# Patient Record
Sex: Female | Born: 1980 | Hispanic: Yes | Marital: Single | Smoking: Never smoker
Health system: Southern US, Community
[De-identification: ages and names within clinical notes are randomized; demographics above are authoritative.]

---

## 2017-10-21 ENCOUNTER — Ambulatory Visit: Payer: Self-pay | Attending: Oncology

## 2017-10-21 ENCOUNTER — Encounter (INDEPENDENT_AMBULATORY_CARE_PROVIDER_SITE_OTHER): Payer: Self-pay

## 2017-10-21 ENCOUNTER — Ambulatory Visit
Admission: RE | Admit: 2017-10-21 | Discharge: 2017-10-21 | Disposition: A | Discharge status: Home / Self Care | Payer: Self-pay | Source: Ambulatory Visit | Attending: Oncology | Admitting: Oncology

## 2017-10-21 ENCOUNTER — Other Ambulatory Visit: Payer: Self-pay

## 2017-10-21 VITALS — BP 103/62 | HR 68 | Temp 98.2°F | Ht 61.0 in | Wt 117.0 lb

## 2017-10-21 DIAGNOSIS — Z0000 Encounter for general adult medical examination without abnormal findings: Secondary | ICD-10-CM

## 2017-10-21 NOTE — Progress Notes (Signed)
Letter mailed from [facility] to notify of normal mammogram results.  Patient to return in one year for annual screening.  Copy to [facility]. 

## 2017-10-21 NOTE — Progress Notes (Signed)
Subjective:     Patient ID: Beth Allen, female   DOB: Jun 30, 1981, 36 y.o.   MRN: [id number]  HPI   Review of Systems     Objective:   Physical Exam  Pulmonary/Chest: Right breast exhibits no inverted nipple, no mass, no nipple discharge, no skin change and no tenderness. Left breast exhibits no inverted nipple, no mass, no nipple discharge, no skin change and no tenderness. Breasts are symmetrical.       Assessment:     36 year old hispanic female patient presents for B[facility]CP clinic visit.  Patient has complaint of outer quadrant breast pain.  Beth Allen interpreted exam. Patient screened, and meets BCCCP eligibility.  Patient does not have insurance, Medicare or Medicaid.  Handout given on Affordable Care Act.  Instructed patient on breast self-exam using teach back method.  CBE unremarkable.  Palpated symmetrical fibroglandular tissue upper outer quadrants.     Plan:     Sent for bilateral screening mammogram.

## 2019-01-26 ENCOUNTER — Encounter: Payer: Self-pay | Admitting: Emergency Medicine

## 2019-01-26 ENCOUNTER — Emergency Department
Admission: EM | Admit: 2019-01-26 | Discharge: 2019-01-26 | Disposition: A | Discharge status: Home / Self Care | Payer: Self-pay | Attending: Emergency Medicine | Admitting: Emergency Medicine

## 2019-01-26 ENCOUNTER — Other Ambulatory Visit: Payer: Self-pay

## 2019-01-26 DIAGNOSIS — N938 Other specified abnormal uterine and vaginal bleeding: Secondary | ICD-10-CM | POA: Insufficient documentation

## 2019-01-26 LAB — POCT PREGNANCY, URINE: PREG TEST UR: NEGATIVE

## 2019-01-26 LAB — URINALYSIS, COMPLETE (UACMP) WITH MICROSCOPIC
Bacteria, UA: NONE SEEN
Bilirubin Urine: NEGATIVE
Glucose, UA: NEGATIVE mg/dL
Ketones, ur: NEGATIVE mg/dL
Nitrite: NEGATIVE
PROTEIN: 30 mg/dL — AB
Specific Gravity, Urine: 1.01 (ref 1.005–1.030)
pH: 8 (ref 5.0–8.0)

## 2019-01-26 LAB — COMPREHENSIVE METABOLIC PANEL
ALT: 14 U/L (ref 0–44)
AST: 21 U/L (ref 15–41)
Albumin: 4.4 g/dL (ref 3.5–5.0)
Alkaline Phosphatase: 49 U/L (ref 38–126)
Anion gap: 7 (ref 5–15)
BUN: 10 mg/dL (ref 6–20)
CHLORIDE: 106 mmol/L (ref 98–111)
CO2: 27 mmol/L (ref 22–32)
Calcium: 9.1 mg/dL (ref 8.9–10.3)
Creatinine, Ser: 0.59 mg/dL (ref 0.44–1.00)
GFR calc Af Amer: >60 mL/min (ref >60)
GFR calc non Af Amer: >60 mL/min (ref >60)
Glucose, Bld: 93 mg/dL (ref 70–99)
Potassium: 4.2 mmol/L (ref 3.5–5.1)
Sodium: 140 mmol/L (ref 135–145)
Total Bilirubin: 0.4 mg/dL (ref 0.3–1.2)
Total Protein: 7.1 g/dL (ref 6.5–8.1)

## 2019-01-26 LAB — CBC
HCT: 40.7 % (ref 36.0–46.0)
HEMOGLOBIN: 13.6 g/dL (ref 12.0–15.0)
MCH: 29.6 pg (ref 26.0–34.0)
MCHC: 33.4 g/dL (ref 30.0–36.0)
MCV: 88.5 fL (ref 80.0–100.0)
Platelets: 268 10*3/uL (ref 150–400)
RBC: 4.6 MIL/uL (ref 3.87–5.11)
RDW: 12.1 % (ref 11.5–15.5)
WBC: 6.6 10*3/uL (ref 4.0–10.5)
nRBC: 0 % (ref 0.0–0.2)

## 2019-01-26 LAB — LIPASE, BLOOD: LIPASE: 27 U/L (ref 11–51)

## 2019-01-26 NOTE — ED Notes (Signed)
AAOx3.  Skin warm and dry.  NAD 

## 2019-01-26 NOTE — ED Triage Notes (Signed)
PT c/o lower abd pain with vaginal bleeding. States her period has been irregular this month. PT states weakness. VSS

## 2019-01-26 NOTE — ED Provider Notes (Signed)
[facility] Emergency Department Provider Note   ____________________________________________    I have reviewed the triage vital signs and the nursing notes.   HISTORY  Chief Complaint Vaginal Bleeding   Spanish interpreter used  HPI Beth Allen is a 38 y.o. female who presents with complaints of vaginal bleeding.  Patient reports she had her usual menstrual cycle on February 3-7 however approximately 1 week ago she started bleeding again at times heavily.  Occasional abdominal cramping but none today.  No nausea or vomiting.  Not on birth control.  No history of similar bleeding.  No new medications  History reviewed. No pertinent past medical history.  There are no active problems to display for this patient.   History reviewed. No pertinent surgical history.  Prior to Admission medications   Not on File     Allergies Patient has no known allergies.  No family history on file.  Social History Social History   Tobacco Use  . Smoking status: Never Smoker  . Smokeless tobacco: Never Used  Substance Use Topics  . Alcohol use: Not Currently  . Drug use: Never    Review of Systems  Constitutional: No fever/chills Eyes: No visual changes.  ENT: No sore throat. Cardiovascular: Denies chest pain. Respiratory: Denies shortness of breath. Gastrointestinal: As above Genitourinary: As above Musculoskeletal: Negative for back pain. Skin: Negative for rash. Neurological: Negative for headaches   ____________________________________________   PHYSICAL EXAM:  VITAL SIGNS: ED Triage Vitals  Enc Vitals Group     BP 01/26/19 1405 109/62     Pulse Rate 01/26/19 1405 88     Resp 01/26/19 1405 17     Temp 01/26/19 1405 98.4 F (36.9 C)     Temp Source 01/26/19 1405 Oral     SpO2 01/26/19 1405 100 %     Weight 01/26/19 1406 55.3 kg (122 lb)     Height 01/26/19 1406 1.575 m (5\' 2" )     Head Circumference --      Peak Flow --        Pain Score 01/26/19 1405 5     Pain Loc --      Pain Edu? --      Excl. in GC? --     Constitutional: Alert and oriented. No acute distress. Nose: No congestion/rhinnorhea. Mouth/Throat: Mucous membranes are moist.    Cardiovascular: Normal rate, regular rhythm. Grossly normal heart sounds.  Good peripheral circulation. Respiratory: Normal respiratory effort.  No retractions. Lungs CTAB. Gastrointestinal: Soft and nontender. No distention.   Genitourinary: deferred for specialist Musculoskeletal:  Warm and well perfused Neurologic:  Normal speech and language. No gross focal neurologic deficits are appreciated.  Skin:  Skin is warm, dry and intact. No rash noted.   ____________________________________________   LABS (all labs ordered are listed, but only abnormal results are displayed)  Labs Reviewed  URINALYSIS, COMPLETE (UACMP) WITH MICROSCOPIC - Abnormal; Notable for the following components:      Result Value   Color, Urine YELLOW (*)    APPearance CLEAR (*)    Hgb urine dipstick LARGE (*)    Protein, ur 30 (*)    Leukocytes,Ua TRACE (*)    RBC / HPF >50 (*)    All other components within normal limits  LIPASE, BLOOD  COMPREHENSIVE METABOLIC PANEL  CBC  POC URINE PREG, ED  POCT PREGNANCY, URINE   ____________________________________________  EKG  None ____________________________________________  RADIOLOGY  None ____________________________________________   PROCEDURES  Procedure(s) performed: No  Procedures   Critical Care performed: No ____________________________________________   INITIAL IMPRESSION / ASSESSMENT AND PLAN / ED COURSE  Pertinent labs & imaging results that were available during my care of the patient were reviewed by me and considered in my medical decision making (see chart for details).  Patient well-appearing and in no acute distress, negative pregnancy test, lab work is quite reassuring, hemoglobin 13.6.  Normal vital  sign, unremarkable exam.  Discussed with patient the need for outpatient follow-up with gynecology for further evaluation of dysfunctional uterine bleeding    ____________________________________________   FINAL CLINICAL IMPRESSION(S) / ED DIAGNOSES  Final diagnoses:  Dysfunctional uterine bleeding        Note:  This document was prepared using Dragon voice recognition software and may include unintentional dictation errors.   Jene Every, MD 01/26/19 [phone]

## 2020-07-20 ENCOUNTER — Ambulatory Visit: Payer: Self-pay | Attending: Oncology

## 2020-07-20 ENCOUNTER — Other Ambulatory Visit: Payer: Self-pay

## 2020-07-20 VITALS — BP 98/38 | HR 69 | Temp 97.9°F | Ht 61.75 in | Wt 126.0 lb

## 2020-07-20 DIAGNOSIS — N644 Mastodynia: Secondary | ICD-10-CM

## 2020-07-20 DIAGNOSIS — N63 Unspecified lump in unspecified breast: Secondary | ICD-10-CM

## 2020-07-20 NOTE — Progress Notes (Signed)
  Subjective:     Patient ID: Beth Allen, female   DOB: Sep 23, 1981, 39 y.o.   MRN: [id number]  HPI   Review of Systems     Objective:   Physical Exam Chest:     Breasts:        Right: Tenderness present.        Left: Tenderness present.       Comments: Bilateral breast thickening, and pain       Assessment:     39 year old Hispanic patient returns for B[facility]CP screening.  Delos Haring interprets exam.  Patient complains of constant right breast pain x 3 months, and reports left breast upper outer pain on palpation today.  Patient screened, and meets BCCCP eligibility.  Patient does not have insurance, Medicare or Medicaid. Instructed patient on breast self awareness using teach back method.  Clinical breast exam reveals bilateral upper outer quadrant thickening bilateral, and right breast thickening 6 o'clock with associated tenderness.    Plan:     Scheduled patient for bilateral diagnostic mammogram, and ultrasound 07/27/2020.  Printed reminder given to patient.

## 2020-07-27 ENCOUNTER — Ambulatory Visit
Admission: RE | Admit: 2020-07-27 | Discharge: 2020-07-27 | Disposition: A | Discharge status: Home / Self Care | Payer: Self-pay | Source: Ambulatory Visit | Attending: Oncology | Admitting: Oncology

## 2020-07-27 ENCOUNTER — Other Ambulatory Visit: Payer: Self-pay

## 2020-07-27 DIAGNOSIS — N63 Unspecified lump in unspecified breast: Secondary | ICD-10-CM

## 2020-07-27 DIAGNOSIS — N644 Mastodynia: Secondary | ICD-10-CM

## 2020-07-27 NOTE — Progress Notes (Signed)
Letter mailed from [facility] to notify of normal mammogram results.  Patient to return in one year for annual screening.  Copy to [facility]. 

## 2021-09-26 NOTE — Progress Notes (Signed)
Patient pre-screened for B[facility] eligibility due to COVID 19 precautions. Two patient identifiers used for verification that I was speaking to correct patient.  Patient to Present directly to [facility] 09/27/21 for B[facility] screening mammogram.  

## 2021-09-27 ENCOUNTER — Ambulatory Visit
Admission: RE | Admit: 2021-09-27 | Discharge: 2021-09-27 | Disposition: A | Discharge status: Home / Self Care | Payer: Self-pay | Source: Ambulatory Visit | Attending: Oncology | Admitting: Oncology

## 2021-09-27 ENCOUNTER — Ambulatory Visit: Payer: Self-pay | Attending: Oncology

## 2021-09-27 ENCOUNTER — Other Ambulatory Visit: Payer: Self-pay

## 2021-09-27 DIAGNOSIS — Z0000 Encounter for general adult medical examination without abnormal findings: Secondary | ICD-10-CM | POA: Insufficient documentation

## 2021-09-28 ENCOUNTER — Other Ambulatory Visit: Payer: Self-pay

## 2021-09-28 DIAGNOSIS — R920 Mammographic microcalcification found on diagnostic imaging of breast: Secondary | ICD-10-CM

## 2021-10-12 ENCOUNTER — Ambulatory Visit
Admission: RE | Admit: 2021-10-12 | Discharge: 2021-10-12 | Disposition: A | Discharge status: Home / Self Care | Payer: Self-pay | Source: Ambulatory Visit | Attending: Oncology | Admitting: Oncology

## 2021-10-12 ENCOUNTER — Other Ambulatory Visit: Payer: Self-pay

## 2021-10-12 DIAGNOSIS — R920 Mammographic microcalcification found on diagnostic imaging of breast: Secondary | ICD-10-CM | POA: Insufficient documentation

## 2021-12-06 ENCOUNTER — Encounter: Payer: Self-pay | Admitting: *Deleted

## 2021-12-06 ENCOUNTER — Other Ambulatory Visit: Payer: Self-pay | Admitting: *Deleted

## 2021-12-06 DIAGNOSIS — R920 Mammographic microcalcification found on diagnostic imaging of breast: Secondary | ICD-10-CM

## 2021-12-06 NOTE — Progress Notes (Signed)
Letter mailed to inform patient of her next mammogram on 04/02/22 @ 10:20.  Will transition care to Fonnie Mu, RN with [facility].

## 2022-04-12 ENCOUNTER — Ambulatory Visit
Admission: RE | Admit: 2022-04-12 | Discharge: 2022-04-12 | Disposition: A | Discharge status: Home / Self Care | Payer: Self-pay | Source: Ambulatory Visit | Attending: Obstetrics and Gynecology | Admitting: Obstetrics and Gynecology

## 2022-04-12 DIAGNOSIS — R920 Mammographic microcalcification found on diagnostic imaging of breast: Secondary | ICD-10-CM

## 2022-04-25 ENCOUNTER — Other Ambulatory Visit: Payer: Self-pay | Admitting: Obstetrics and Gynecology

## 2022-04-25 DIAGNOSIS — N63 Unspecified lump in unspecified breast: Secondary | ICD-10-CM

## 2022-04-26 ENCOUNTER — Other Ambulatory Visit: Payer: Self-pay

## 2022-04-26 DIAGNOSIS — N6310 Unspecified lump in the right breast, unspecified quadrant: Secondary | ICD-10-CM

## 2022-08-02 ENCOUNTER — Telehealth: Payer: Self-pay | Admitting: *Deleted

## 2022-10-02 ENCOUNTER — Ambulatory Visit: Payer: Self-pay

## 2022-10-10 ENCOUNTER — Ambulatory Visit: Payer: Self-pay

## 2022-10-15 ENCOUNTER — Ambulatory Visit: Payer: Self-pay | Attending: Hematology and Oncology | Admitting: Hematology and Oncology

## 2022-10-15 ENCOUNTER — Other Ambulatory Visit: Payer: Self-pay | Admitting: Obstetrics and Gynecology

## 2022-10-15 ENCOUNTER — Ambulatory Visit
Admission: RE | Admit: 2022-10-15 | Discharge: 2022-10-15 | Disposition: A | Discharge status: Home / Self Care | Payer: Self-pay | Source: Ambulatory Visit | Attending: Obstetrics and Gynecology | Admitting: Obstetrics and Gynecology

## 2022-10-15 VITALS — BP 113/65 | Wt 141.9 lb

## 2022-10-15 DIAGNOSIS — N6310 Unspecified lump in the right breast, unspecified quadrant: Secondary | ICD-10-CM | POA: Insufficient documentation

## 2022-10-15 NOTE — Progress Notes (Signed)
Beth Allen is a 41 y.o. female who presents to [facility] clinic today with no complaints.    Pap Smear: Pap not smear completed today. Last Pap smear was 2022 at [facility] clinic and was normal. Per patient has no history of an abnormal Pap smear. Last Pap smear result is not available in Epic.   Physical exam: Breasts Breasts symmetrical. No skin abnormalities bilateral breasts. No nipple retraction bilateral breasts. No nipple discharge bilateral breasts. No lymphadenopathy. No lumps palpated bilateral breasts.     MS DIGITAL DIAG TOMO UNI RIGHT  Result Date: 04/12/2022 CLINICAL DATA:  Short-term follow-up for probably benign right breast calcifications, initially assessed on 10/12/2021. EXAM: DIGITAL DIAGNOSTIC UNILATERAL RIGHT MAMMOGRAM WITH TOMOSYNTHESIS AND CAD TECHNIQUE: Right digital diagnostic mammography and breast tomosynthesis was performed. The images were evaluated with computer-aided detection. COMPARISON:  Previous exam(s). ACR Breast Density Category d: The breast tissue is extremely dense, which lowers the sensitivity of mammography. FINDINGS: The loosely grouped punctate and small round calcifications in the lateral right breast are stable. There are no masses or areas of architectural distortion. There are no new calcifications. IMPRESSION: 1. Probably benign right breast calcifications, stable for 6 months. Additional short-term follow-up recommended. RECOMMENDATION: Diagnostic mammography with right breast magnification views in 6 months. I have discussed the findings and recommendations with the patient. If applicable, a reminder letter will be sent to the patient regarding the next appointment. BI-RADS CATEGORY  3: Probably benign. Electronically Signed   By: Amie Portland M.D.   On: 04/12/2022 10:41  MS DIGITAL DIAG TOMO UNI RIGHT  Result Date: 10/12/2021 CLINICAL DATA:  Patient recalled from screening for right breast calcifications. EXAM: DIGITAL DIAGNOSTIC  UNILATERAL RIGHT MAMMOGRAM WITH TOMOSYNTHESIS AND CAD TECHNIQUE: Right digital diagnostic mammography and breast tomosynthesis was performed. The images were evaluated with computer-aided detection. COMPARISON:  Previous exam(s). ACR Breast Density Category d: The breast tissue is extremely dense, which lowers the sensitivity of mammography. FINDINGS: Magnification views of the right breast were obtained. There are loosely grouped likely early dystrophic calcifications within the upper outer right breast middle depth. IMPRESSION: Probably benign right breast calcifications. RECOMMENDATION: Right breast diagnostic mammogram with magnification views in 6 months to reassess probably benign right breast calcifications. I have discussed the findings and recommendations with the patient. If applicable, a reminder letter will be sent to the patient regarding the next appointment. BI-RADS CATEGORY  3: Probably benign. Electronically Signed   By: Annia Belt M.D.   On: 10/12/2021 14:55  MS DIGITAL SCREENING TOMO BILATERAL  Addendum Date: 10/03/2021   ADDENDUM REPORT: 10/03/2021 14:40 ADDENDUM: Correction to report: Patient does not have implants. Electronically Signed   By: Norva Pavlov M.D.   On: 10/03/2021 14:40   Result Date: 10/03/2021 CLINICAL DATA:  Screening. EXAM: DIGITAL SCREENING BILATERAL MAMMOGRAM WITH TOMOSYNTHESIS AND CAD TECHNIQUE: Bilateral screening digital craniocaudal and mediolateral oblique mammograms were obtained. Bilateral screening digital breast tomosynthesis was performed. The images were evaluated with computer-aided detection. COMPARISON:  Previous exam(s). ACR Breast Density Category c: The breast tissue is heterogeneously dense, which may obscure small masses. FINDINGS: The patient has retropectoral implants. In the right breast, calcifications warrant further evaluation with magnified views. In the left breast, no findings suspicious for malignancy. IMPRESSION: Further evaluation is  suggested for calcifications in the right breast. RECOMMENDATION: Diagnostic mammogram of the right breast. (Code:[id number]-73M) The patient will be contacted regarding the findings, and additional imaging will be scheduled. BI-RADS CATEGORY  0: Incomplete. Need additional  imaging evaluation and/or prior mammograms for comparison. Electronically Signed: By: Norva Pavlov M.D. On: 09/28/2021 08:11   MS DIGITAL DIAG TOMO BILAT  Result Date: 07/27/2020 CLINICAL DATA:  Patient complains of diffuse right breast pain. EXAM: DIGITAL DIAGNOSTIC BILATERAL MAMMOGRAM WITH TOMO AND CAD COMPARISON:  Previous exam(s). ACR Breast Density Category d: The breast tissue is extremely dense, which lowers the sensitivity of mammography. FINDINGS: No suspicious mass, malignant type microcalcifications or distortion detected in either breast. Mammographic images were processed with CAD. IMPRESSION: No evidence of malignancy in either breast. RECOMMENDATION: Bilateral screening mammogram in 1 year is recommended. I have discussed the findings and recommendations with the patient. If applicable, a reminder letter will be sent to the patient regarding the next appointment. BI-RADS CATEGORY  1: Negative. Electronically Signed   By: Baird Lyons M.D.   On: 07/27/2020 09:58   MS DIGITAL SCREENING TOMO BILATERAL  Result Date: 10/21/2017 CLINICAL DATA:  Screening. EXAM: 2D DIGITAL SCREENING BILATERAL MAMMOGRAM WITH CAD AND ADJUNCT TOMO COMPARISON:  None. ACR Breast Density Category c: The breast tissue is heterogeneously dense, which may obscure small masses FINDINGS: There are no findings suspicious for malignancy. Images were processed with CAD. IMPRESSION: No mammographic evidence of malignancy. A result letter of this screening mammogram will be mailed directly to the patient. RECOMMENDATION: Screening mammogram at age 75. (Code:[id number]-40A) BI-RADS CATEGORY  1: Negative. Electronically Signed   By: Ted Mcalpine M.D.   On: 10/21/2017  14:05      Pelvic/Bimanual Pap is not indicated today    Smoking History: Patient has never smoked and was not referred to quit line.    Patient Navigation: Patient education provided. Access to services provided for patient through [facility] program. Karolee Stamps interpreter provided. No transportation provided   Colorectal Cancer Screening: Per patient has never had colonoscopy completed No complaints today.    Breast and Cervical Cancer Risk Assessment: Patient does not have family history of breast cancer, known genetic mutations, or radiation treatment to the chest before age 4. Patient does not have history of cervical dysplasia, immunocompromised, or DES exposure in-utero.  Risk Scores as of 10/15/2022     Beth Allen           5-year 0.47 %   Lifetime 6.52 %   This patient is Hispana/Latina but has no documented birth country, so the [employer] model used data from [place] patients to calculate their risk score. Document a birth country in the Demographics activity for a more accurate score.         Last calculated by Narda Rutherford, LPN on 32/99/2426 at  9:16 AM        A: [facility] exam without pap smear No complaints. Benign exam. Follow up of probable benign right breast calcifications.   P: [facility]. Appointment scheduled 10/15/22.  Pascal Lux, NP 10/15/2022 9:21 AM

## 2022-10-15 NOTE — Patient Instructions (Signed)
Taught Beth Allen about self breast awareness. Patient did not need a Pap smear today due to last Pap smear was in 2022 per patient. Pap will be due again in 2025Let her know [facility] will cover Pap smears every 3 years unless has a history of abnormal Pap smears. [facility]. Appointment scheduled for 10/15/22. Patient aware of appointment and will be there. Let patient know will follow up with her within the next couple weeks with results. Beth Allen verbalized understanding.  Pascal Lux, NP 9:24 AM

## 2022-11-06 IMAGING — MG MM DIGITAL SCREENING BILAT W/ TOMO AND CAD
8 series · 8 of 24 positions shown · non-contrast
Comparison: Previous exam(s).
COMPARISON: Previous exam(s).

Addendum:
CLINICAL DATA: Screening.

EXAM:
DIGITAL SCREENING BILATERAL MAMMOGRAM WITH TOMOSYNTHESIS AND CAD
TECHNIQUE: Bilateral screening digital craniocaudal and mediolateral oblique
mammograms were obtained. Bilateral screening digital breast
tomosynthesis was performed. The images were evaluated with
computer-aided detection.

[L MLO synth-2D]
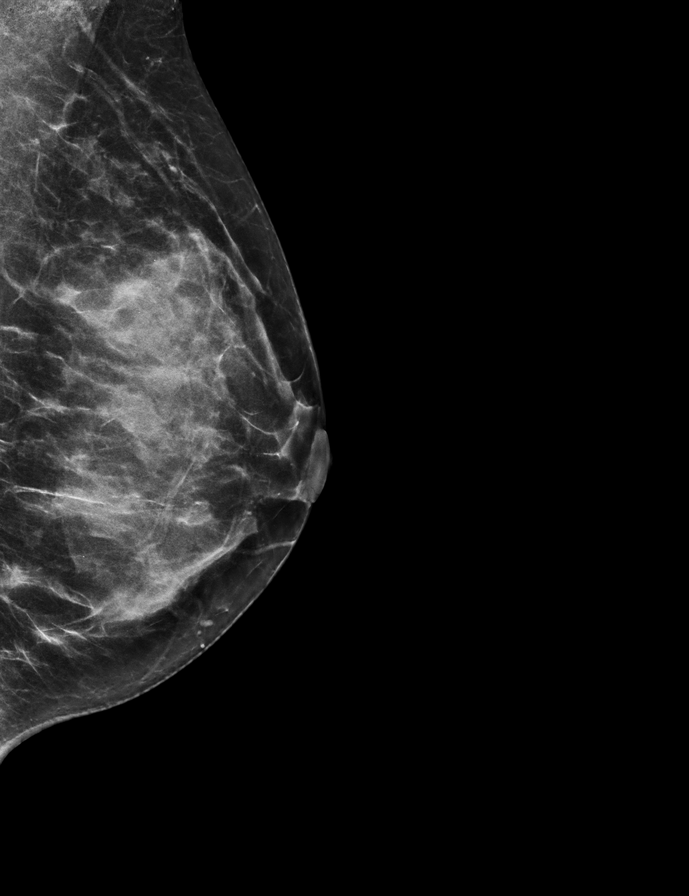

[R MLO synth-2D]
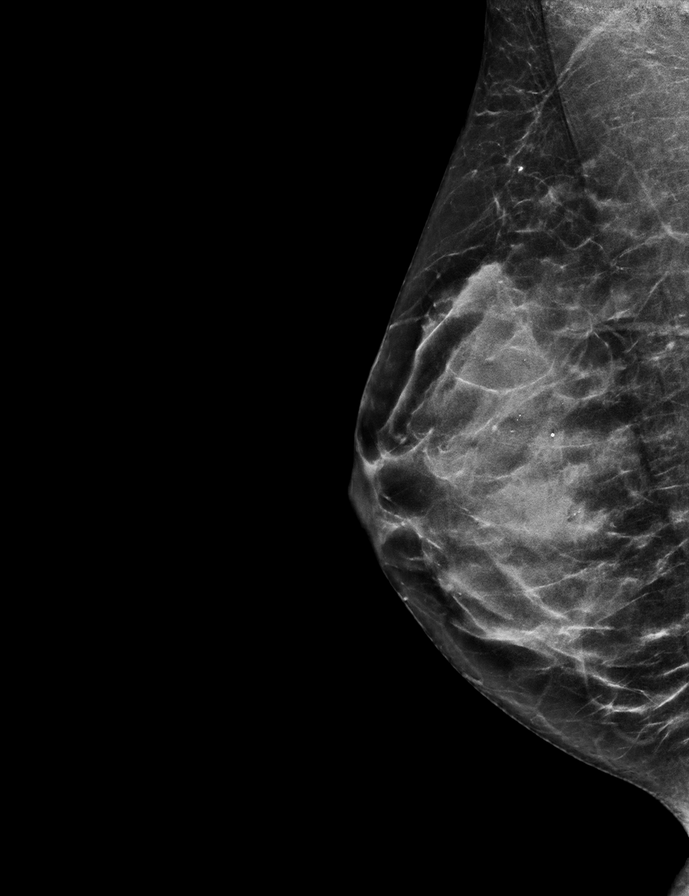

[R CC synth-2D]
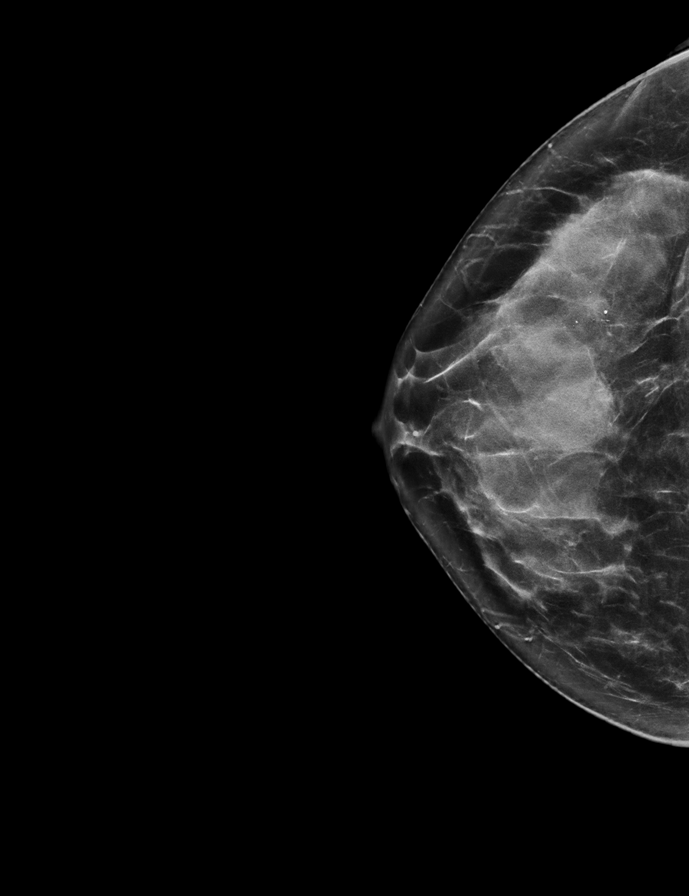

[L CC synth-2D]
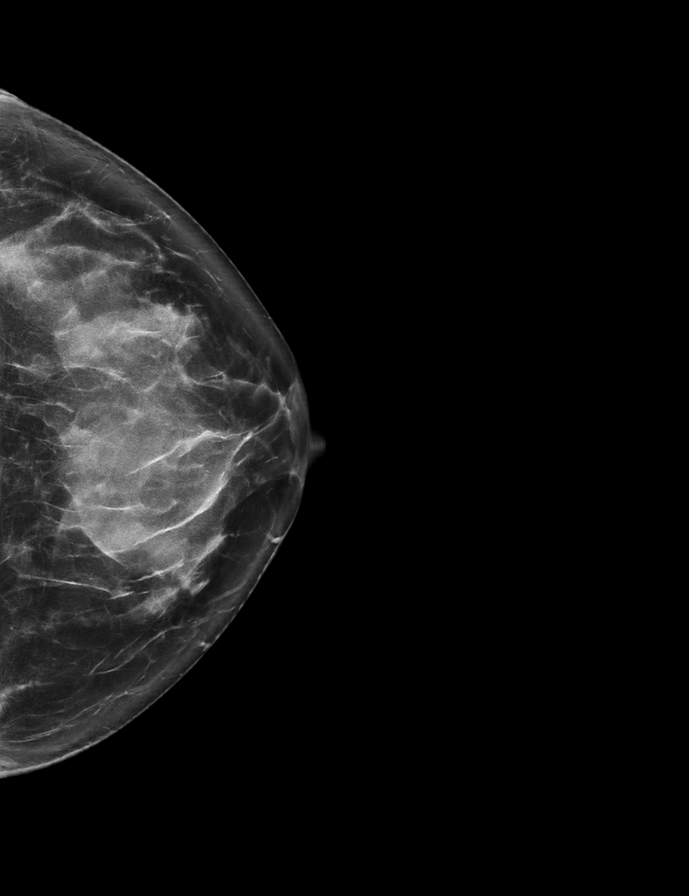

[L MLO tomo · tomo slice 28/55.0]
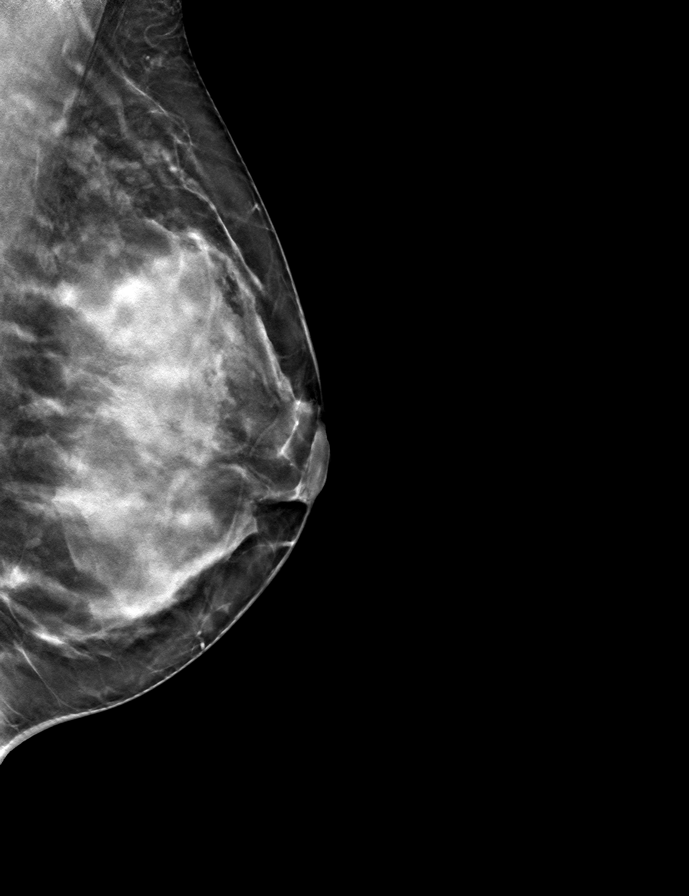

[R CC tomo · tomo slice 33/66.0]
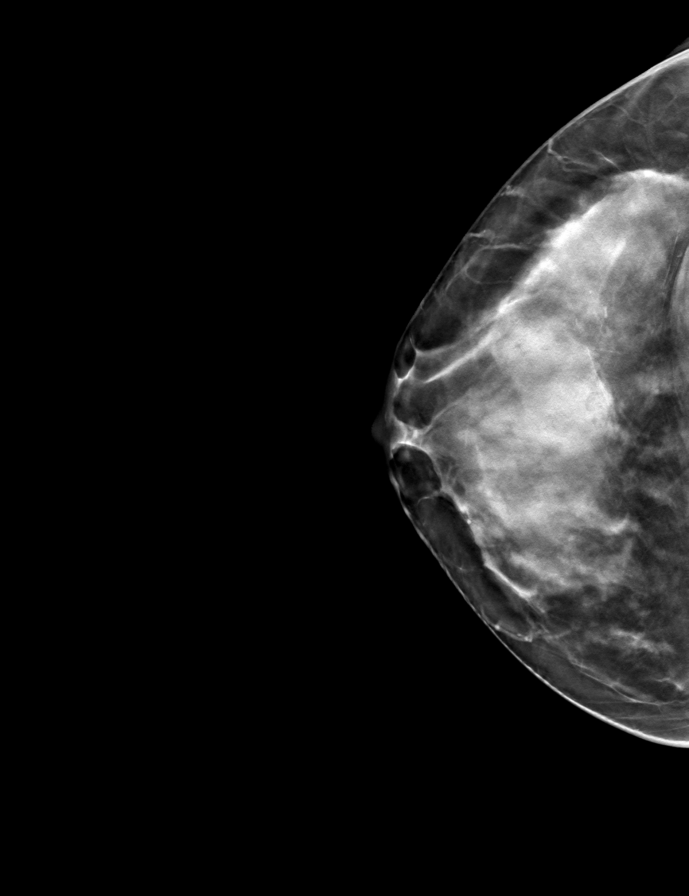

[L CC tomo · tomo slice 36/71.0]
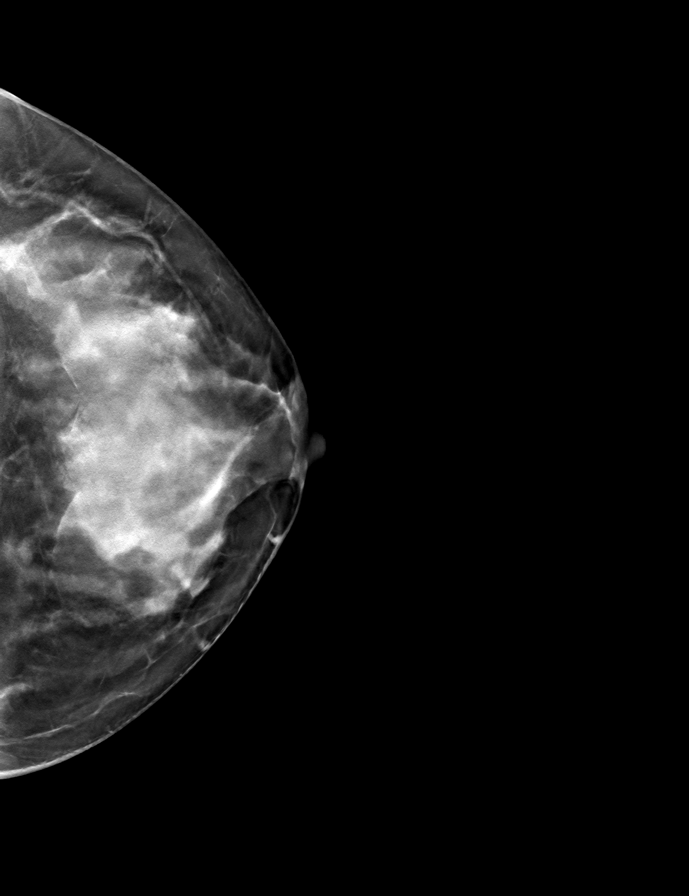

[R MLO tomo · tomo slice 30/59.0]
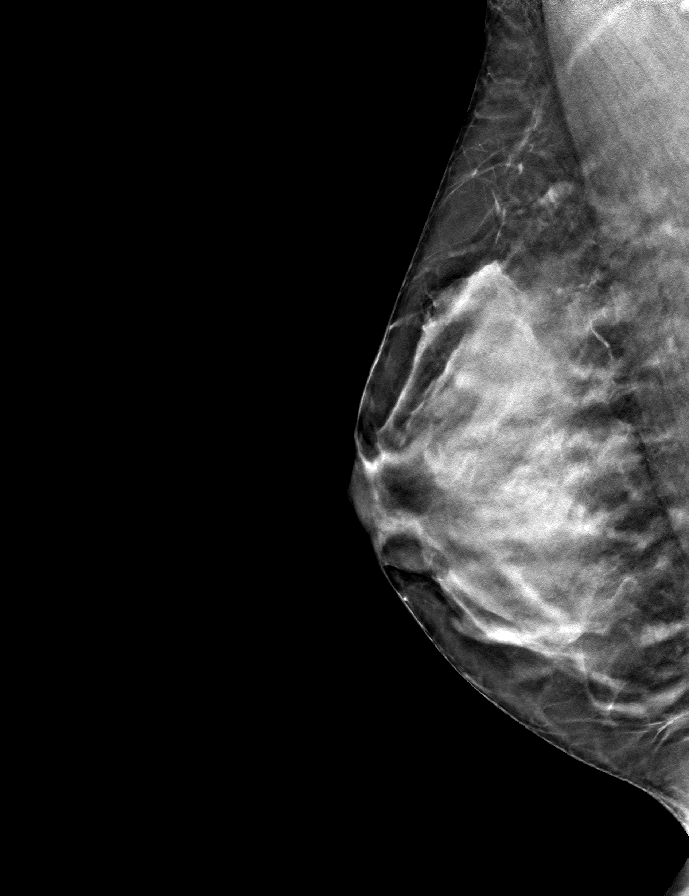

[8 of 24 positions shown; findings below may reference images not displayed]

ACR Breast Density Category c: The breast tissue is heterogeneously
dense, which may obscure small masses.
FINDINGS: The patient has retropectoral implants. In the right breast,
calcifications warrant further evaluation with magnified views. In
the left breast, no findings suspicious for malignancy.
IMPRESSION: Further evaluation is suggested for calcifications in the right
breast.

RECOMMENDATION:
Diagnostic mammogram of the right breast. (Code:[id number])

The patient will be contacted regarding the findings, and additional
imaging will be scheduled.

BI-RADS CATEGORY  0: Incomplete. Need additional imaging evaluation
and/or prior mammograms for comparison.

ADDENDUM:
Correction to report:

Patient does not have implants.

*** End of Addendum ***
ACR Breast Density Category c: The breast tissue is heterogeneously
dense, which may obscure small masses.
FINDINGS: The patient has retropectoral implants. In the right breast,
calcifications warrant further evaluation with magnified views. In
the left breast, no findings suspicious for malignancy.
IMPRESSION: Further evaluation is suggested for calcifications in the right
breast.

RECOMMENDATION:
Diagnostic mammogram of the right breast. (Code:[id number])

The patient will be contacted regarding the findings, and additional
imaging will be scheduled.

BI-RADS CATEGORY  0: Incomplete. Need additional imaging evaluation
and/or prior mammograms for comparison.

## 2023-07-31 ENCOUNTER — Other Ambulatory Visit: Payer: Self-pay

## 2023-07-31 DIAGNOSIS — R920 Mammographic microcalcification found on diagnostic imaging of breast: Secondary | ICD-10-CM

## 2023-08-01 ENCOUNTER — Encounter: Payer: Self-pay | Admitting: Family Medicine

## 2023-10-21 ENCOUNTER — Ambulatory Visit
Admission: RE | Admit: 2023-10-21 | Discharge: 2023-10-21 | Disposition: A | Discharge status: Home / Self Care | Payer: Self-pay | Source: Ambulatory Visit | Attending: Obstetrics and Gynecology | Admitting: Obstetrics and Gynecology

## 2023-10-21 ENCOUNTER — Ambulatory Visit: Payer: Self-pay | Attending: Hematology and Oncology | Admitting: Hematology and Oncology

## 2023-10-21 VITALS — BP 99/58 | Wt 147.0 lb

## 2023-10-21 DIAGNOSIS — R920 Mammographic microcalcification found on diagnostic imaging of breast: Secondary | ICD-10-CM | POA: Insufficient documentation

## 2023-10-21 DIAGNOSIS — R921 Mammographic calcification found on diagnostic imaging of breast: Secondary | ICD-10-CM

## 2023-10-21 NOTE — Patient Instructions (Signed)
Taught Beth Allen about self breast awareness and gave educational materials to take home. Patient did not need a Pap smear today due to last Pap smear was in 08/22/2021 per patient.  Let her know [facility] will cover Pap smears every 5 years unless has a history of abnormal Pap smears. Referred patient to the [facility] for diagnostic mammogram. Appointment scheduled for 10/21/2023. Patient aware of appointment and will be there. Let patient know will follow up with her within the next couple weeks with results. Adonna Chivonne Heide verbalized understanding.  Pascal Lux, NP 9:17 AM

## 2023-10-21 NOTE — Progress Notes (Signed)
Beth Allen is a 42 y.o. female who presents to [facility] clinic today with no complaints. Follow up right breast calcifications.    Pap Smear: Pap not smear completed today. Last Pap smear was 08/22/2021 and was normal. Per patient has no history of an abnormal Pap smear. Last Pap smear result is available in Epic.   Physical exam: Breasts Breasts symmetrical. No skin abnormalities bilateral breasts. No nipple retraction bilateral breasts. No nipple discharge bilateral breasts. No lymphadenopathy. No lumps palpated bilateral breasts.   MS DIGITAL DIAG TOMO BILAT  Result Date: 10/15/2022 CLINICAL DATA:  BI-RADS 3 follow-up of RIGHT breast calcifications, initiated November 2022 EXAM: DIGITAL DIAGNOSTIC BILATERAL MAMMOGRAM WITH TOMOSYNTHESIS TECHNIQUE: Bilateral digital diagnostic mammography and breast tomosynthesis was performed. COMPARISON:  Previous exam(s). ACR Breast Density Category d: The breast tissue is extremely dense, which lowers the sensitivity of mammography. FINDINGS: Spot magnification views of the RIGHT breast demonstrate mammographic stability of loosely grouped punctate calcifications in the lateral breast at posterior depth. There is a benign lucent centered calcification consistent with a benign dystrophic calcification in this area. No new suspicious findings are noted in the RIGHT breast. No suspicious mass, distortion, or microcalcifications are identified to suggest presence of malignancy in the LEFT breast. IMPRESSION: 1. Stable probably benign RIGHT breast calcifications. Recommend diagnostic mammogram in 1 year. This will establish 2 years of definitive stability. 2. No mammographic evidence of malignancy in the LEFT breast. RECOMMENDATION: Bilateral diagnostic mammogram (with RIGHT and LEFT breast ultrasound if deemed necessary) in 1 year I have discussed the findings and recommendations with the patient in Spanish. If applicable, a reminder letter will be sent to the  patient regarding the next appointment. BI-RADS CATEGORY  3: Probably benign. Electronically Signed   By: Meda Klinefelter M.D.   On: 10/15/2022 11:48  MS DIGITAL DIAG TOMO UNI RIGHT  Result Date: 04/12/2022 CLINICAL DATA:  Short-term follow-up for probably benign right breast calcifications, initially assessed on 10/12/2021. EXAM: DIGITAL DIAGNOSTIC UNILATERAL RIGHT MAMMOGRAM WITH TOMOSYNTHESIS AND CAD TECHNIQUE: Right digital diagnostic mammography and breast tomosynthesis was performed. The images were evaluated with computer-aided detection. COMPARISON:  Previous exam(s). ACR Breast Density Category d: The breast tissue is extremely dense, which lowers the sensitivity of mammography. FINDINGS: The loosely grouped punctate and small round calcifications in the lateral right breast are stable. There are no masses or areas of architectural distortion. There are no new calcifications. IMPRESSION: 1. Probably benign right breast calcifications, stable for 6 months. Additional short-term follow-up recommended. RECOMMENDATION: Diagnostic mammography with right breast magnification views in 6 months. I have discussed the findings and recommendations with the patient. If applicable, a reminder letter will be sent to the patient regarding the next appointment. BI-RADS CATEGORY  3: Probably benign. Electronically Signed   By: Amie Portland M.D.   On: 04/12/2022 10:41  MS DIGITAL DIAG TOMO UNI RIGHT  Result Date: 10/12/2021 CLINICAL DATA:  Patient recalled from screening for right breast calcifications. EXAM: DIGITAL DIAGNOSTIC UNILATERAL RIGHT MAMMOGRAM WITH TOMOSYNTHESIS AND CAD TECHNIQUE: Right digital diagnostic mammography and breast tomosynthesis was performed. The images were evaluated with computer-aided detection. COMPARISON:  Previous exam(s). ACR Breast Density Category d: The breast tissue is extremely dense, which lowers the sensitivity of mammography. FINDINGS: Magnification views of the right  breast were obtained. There are loosely grouped likely early dystrophic calcifications within the upper outer right breast middle depth. IMPRESSION: Probably benign right breast calcifications. RECOMMENDATION: Right breast diagnostic mammogram with magnification views in 6 months  to reassess probably benign right breast calcifications. I have discussed the findings and recommendations with the patient. If applicable, a reminder letter will be sent to the patient regarding the next appointment. BI-RADS CATEGORY  3: Probably benign. Electronically Signed   By: Annia Belt M.D.   On: 10/12/2021 14:55  MS DIGITAL SCREENING TOMO BILATERAL  Addendum Date: 10/03/2021   ADDENDUM REPORT: 10/03/2021 14:40 ADDENDUM: Correction to report: Patient does not have implants. Electronically Signed   By: Norva Pavlov M.D.   On: 10/03/2021 14:40   Result Date: 10/03/2021 CLINICAL DATA:  Screening. EXAM: DIGITAL SCREENING BILATERAL MAMMOGRAM WITH TOMOSYNTHESIS AND CAD TECHNIQUE: Bilateral screening digital craniocaudal and mediolateral oblique mammograms were obtained. Bilateral screening digital breast tomosynthesis was performed. The images were evaluated with computer-aided detection. COMPARISON:  Previous exam(s). ACR Breast Density Category c: The breast tissue is heterogeneously dense, which may obscure small masses. FINDINGS: The patient has retropectoral implants. In the right breast, calcifications warrant further evaluation with magnified views. In the left breast, no findings suspicious for malignancy. IMPRESSION: Further evaluation is suggested for calcifications in the right breast. RECOMMENDATION: Diagnostic mammogram of the right breast. (Code:[id number]) The patient will be contacted regarding the findings, and additional imaging will be scheduled. BI-RADS CATEGORY  0: Incomplete. Need additional imaging evaluation and/or prior mammograms for comparison. Electronically Signed: By: Norva Pavlov M.D. On:  09/28/2021 08:11   MS DIGITAL DIAG TOMO BILAT  Result Date: 07/27/2020 CLINICAL DATA:  Patient complains of diffuse right breast pain. EXAM: DIGITAL DIAGNOSTIC BILATERAL MAMMOGRAM WITH TOMO AND CAD COMPARISON:  Previous exam(s). ACR Breast Density Category d: The breast tissue is extremely dense, which lowers the sensitivity of mammography. FINDINGS: No suspicious mass, malignant type microcalcifications or distortion detected in either breast. Mammographic images were processed with CAD. IMPRESSION: No evidence of malignancy in either breast. RECOMMENDATION: Bilateral screening mammogram in 1 year is recommended. I have discussed the findings and recommendations with the patient. If applicable, a reminder letter will be sent to the patient regarding the next appointment. BI-RADS CATEGORY  1: Negative. Electronically Signed   By: Baird Lyons M.D.   On: 07/27/2020 09:58        Pelvic/Bimanual Pap is not indicated today    Smoking History: Patient has never smoked and was not referred to quit line.    Patient Navigation: Patient education provided. Access to services provided for patient through [facility] program. Milly interpreter provided. No transportation provided   Colorectal Cancer Screening: Per patient has never had colonoscopy completed No complaints today.    Breast and Cervical Cancer Risk Assessment: Patient does not have family history of breast cancer, known genetic mutations, or radiation treatment to the chest before age 48. Patient does not have history of cervical dysplasia, immunocompromised, or DES exposure in-utero.  Risk Scores as of Encounter on 10/21/2023     Dondra Spry           5-year 0.5%   Lifetime 6.46%   This patient is [place] but has no documented birth country, so the [place] model used data from [place] patients to calculate their risk score. Document a birth country in the Demographics activity for a more accurate score.         Last calculated by Meryl Dare, CMA on 10/21/2023 at  9:20 AM          A: B[facility]CP exam without pap smear Follow up right breast calcifications. Benign exam.  P: Referred patient to [facility] for a screening mammogram. Appointment scheduled 10/21/2023.  Ilda Basset A, NP 10/21/2023 9:15 AM

## 2024-09-16 ENCOUNTER — Telehealth: Payer: Self-pay

## 2024-09-30 ENCOUNTER — Other Ambulatory Visit: Payer: Self-pay | Admitting: Family Medicine

## 2024-09-30 DIAGNOSIS — Z1231 Encounter for screening mammogram for malignant neoplasm of breast: Secondary | ICD-10-CM

## 2024-11-06 ENCOUNTER — Ambulatory Visit
Admission: RE | Admit: 2024-11-06 | Discharge: 2024-11-06 | Disposition: A | Payer: Self-pay | Source: Ambulatory Visit | Attending: Family Medicine | Admitting: Family Medicine

## 2024-11-06 DIAGNOSIS — Z1231 Encounter for screening mammogram for malignant neoplasm of breast: Secondary | ICD-10-CM
